# Patient Record
Sex: Male | Born: 1975 | Race: Black or African American | Hispanic: No | Marital: Single | Smoking: Current every day smoker
Health system: Southern US, Community
[De-identification: ages and names within clinical notes are randomized; demographics above are authoritative.]

## PROBLEM LIST (undated history)

## (undated) DIAGNOSIS — E119 Type 2 diabetes mellitus without complications: Secondary | ICD-10-CM

## (undated) DIAGNOSIS — I10 Essential (primary) hypertension: Secondary | ICD-10-CM

## (undated) HISTORY — PX: TOTAL HIP ARTHROPLASTY: SHX124

---

## 1999-09-21 ENCOUNTER — Emergency Department (HOSPITAL_COMMUNITY): Admission: EM | Admit: 1999-09-21 | Discharge: 1999-09-21 | Payer: Self-pay | Admitting: *Deleted

## 1999-09-21 ENCOUNTER — Encounter: Payer: Self-pay | Admitting: Emergency Medicine

## 2004-04-21 ENCOUNTER — Emergency Department: Payer: Self-pay | Admitting: Unknown Physician Specialty

## 2004-04-22 ENCOUNTER — Emergency Department: Payer: Self-pay | Admitting: Internal Medicine

## 2004-04-23 ENCOUNTER — Emergency Department: Payer: Self-pay | Admitting: Emergency Medicine

## 2007-06-25 ENCOUNTER — Emergency Department: Payer: Self-pay | Admitting: Emergency Medicine

## 2008-04-13 ENCOUNTER — Emergency Department (HOSPITAL_COMMUNITY): Admission: EM | Admit: 2008-04-13 | Discharge: 2008-04-13 | Payer: Self-pay | Admitting: Emergency Medicine

## 2013-06-16 ENCOUNTER — Emergency Department (HOSPITAL_COMMUNITY)
Admission: EM | Admit: 2013-06-16 | Discharge: 2013-06-16 | Disposition: A | Discharge status: Home / Self Care | Payer: No Typology Code available for payment source | Attending: Emergency Medicine | Admitting: Emergency Medicine

## 2013-06-16 ENCOUNTER — Emergency Department (HOSPITAL_COMMUNITY): Payer: No Typology Code available for payment source

## 2013-06-16 ENCOUNTER — Encounter (HOSPITAL_COMMUNITY): Payer: Self-pay | Admitting: Emergency Medicine

## 2013-06-16 DIAGNOSIS — M5380 Other specified dorsopathies, site unspecified: Secondary | ICD-10-CM | POA: Insufficient documentation

## 2013-06-16 DIAGNOSIS — I10 Essential (primary) hypertension: Secondary | ICD-10-CM | POA: Insufficient documentation

## 2013-06-16 DIAGNOSIS — S99919A Unspecified injury of unspecified ankle, initial encounter: Secondary | ICD-10-CM

## 2013-06-16 DIAGNOSIS — Y92410 Unspecified street and highway as the place of occurrence of the external cause: Secondary | ICD-10-CM | POA: Insufficient documentation

## 2013-06-16 DIAGNOSIS — Y9389 Activity, other specified: Secondary | ICD-10-CM | POA: Insufficient documentation

## 2013-06-16 DIAGNOSIS — S8990XA Unspecified injury of unspecified lower leg, initial encounter: Secondary | ICD-10-CM | POA: Insufficient documentation

## 2013-06-16 DIAGNOSIS — E119 Type 2 diabetes mellitus without complications: Secondary | ICD-10-CM | POA: Insufficient documentation

## 2013-06-16 DIAGNOSIS — M62830 Muscle spasm of back: Secondary | ICD-10-CM

## 2013-06-16 DIAGNOSIS — S99929A Unspecified injury of unspecified foot, initial encounter: Secondary | ICD-10-CM

## 2013-06-16 HISTORY — DX: Essential (primary) hypertension: I10

## 2013-06-16 HISTORY — DX: Type 2 diabetes mellitus without complications: E11.9

## 2013-06-16 MED ORDER — HYDROCODONE-ACETAMINOPHEN 5-325 MG PO TABS: 1.0000 | ORAL_TABLET | Freq: Four times a day (QID) | ORAL | Status: AC | PRN

## 2013-06-16 MED ORDER — HYDROMORPHONE HCL PF 1 MG/ML IJ SOLN
1.0000 mg | Freq: Once | INTRAMUSCULAR | Status: AC
Start: 2013-06-16 — End: 2013-06-16
  Administered 2013-06-16: 1 mg via INTRAMUSCULAR
  Filled 2013-06-16: qty 1

## 2013-06-16 MED ORDER — DIAZEPAM 5 MG PO TABS: 5.0000 mg | ORAL_TABLET | Freq: Three times a day (TID) | ORAL | Status: AC | PRN

## 2013-06-16 MED ORDER — DIAZEPAM 5 MG PO TABS
5.0000 mg | ORAL_TABLET | Freq: Once | ORAL | Status: AC
  Administered 2013-06-16: 5 mg via ORAL
  Filled 2013-06-16: qty 1
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
## 2013-06-16 NOTE — ED Notes (Signed)
MD at bedside. 
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
## 2013-06-16 NOTE — ED Notes (Signed)
Patient transported to X-ray 
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
## 2013-06-16 NOTE — Progress Notes (Signed)
[facility] provided pt with a list of primary care resources. Patient stated that he was getting insurance through job in 30 days.
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
## 2023-08-02 ENCOUNTER — Encounter: Payer: Self-pay | Admitting: Advanced Practice Midwife
